# Patient Record
Sex: Female | Born: 1970 | Race: Black or African American | Hispanic: No | Marital: Single | Smoking: Never smoker
Health system: Southern US, Community
[De-identification: ages and names within clinical notes are randomized; demographics above are authoritative.]

## PROBLEM LIST (undated history)

## (undated) DIAGNOSIS — I10 Essential (primary) hypertension: Secondary | ICD-10-CM

---

## 2020-09-03 ENCOUNTER — Encounter (HOSPITAL_COMMUNITY): Payer: Self-pay

## 2020-09-03 ENCOUNTER — Emergency Department (HOSPITAL_COMMUNITY): Payer: BC Managed Care – PPO

## 2020-09-03 ENCOUNTER — Other Ambulatory Visit: Payer: Self-pay

## 2020-09-03 ENCOUNTER — Emergency Department (HOSPITAL_COMMUNITY)
Admission: EM | Admit: 2020-09-03 | Discharge: 2020-09-03 | Disposition: A | Discharge status: Home / Self Care | Payer: BC Managed Care – PPO | Attending: Emergency Medicine | Admitting: Emergency Medicine

## 2020-09-03 DIAGNOSIS — R519 Headache, unspecified: Secondary | ICD-10-CM | POA: Insufficient documentation

## 2020-09-03 DIAGNOSIS — R55 Syncope and collapse: Secondary | ICD-10-CM | POA: Insufficient documentation

## 2020-09-03 DIAGNOSIS — I10 Essential (primary) hypertension: Secondary | ICD-10-CM | POA: Diagnosis not present

## 2020-09-03 HISTORY — DX: Essential (primary) hypertension: I10

## 2020-09-03 LAB — BASIC METABOLIC PANEL
Anion gap: 6 (ref 5–15)
BUN: 14 mg/dL (ref 6–20)
CO2: 26 mmol/L (ref 22–32)
Calcium: 7.2 mg/dL — ABNORMAL LOW (ref 8.9–10.3)
Chloride: 104 mmol/L (ref 98–111)
Creatinine, Ser: 0.75 mg/dL (ref 0.44–1.00)
GFR, Estimated: >60 mL/min (ref >60)
Glucose, Bld: 96 mg/dL (ref 70–99)
Potassium: 3.7 mmol/L (ref 3.5–5.1)
Sodium: 136 mmol/L (ref 135–145)

## 2020-09-03 LAB — CBC
HCT: 40 % (ref 36.0–46.0)
Hemoglobin: 12.8 g/dL (ref 12.0–15.0)
MCH: 27 pg (ref 26.0–34.0)
MCHC: 32 g/dL (ref 30.0–36.0)
MCV: 84.4 fL (ref 80.0–100.0)
Platelets: 352 10*3/uL (ref 150–400)
RBC: 4.74 MIL/uL (ref 3.87–5.11)
RDW: 14 % (ref 11.5–15.5)
WBC: 8.6 10*3/uL (ref 4.0–10.5)
nRBC: 0 % (ref 0.0–0.2)

## 2020-09-03 LAB — I-STAT BETA HCG BLOOD, ED (MC, WL, AP ONLY): I-stat hCG, quantitative: <5 m[IU]/mL (ref <5)

## 2020-09-03 LAB — CBG MONITORING, ED: Glucose-Capillary: 90 mg/dL (ref 70–99)

## 2020-09-03 NOTE — ED Notes (Signed)
Patient said she cannot provide urine sample right now. She will use call bell when able to provide sample.

## 2020-09-03 NOTE — ED Notes (Signed)
CBG:90 

## 2020-09-03 NOTE — Discharge Instructions (Signed)
Eat and drink as well as you can for the next couple days.  Follow up with your family doc.

## 2020-09-03 NOTE — ED Triage Notes (Signed)
Patient BIB [facility] from a concert tonight. 45 minutes ago she went to bathroom, walked down stairs, started having shortness of breath, heat wave, palpitations, then she remembers waking up on the floor. Patient does not remember falling or anything after that. Patient has pain on the right back of head.   20G right AC BP 150/78 CBG 142 RR 18 O2 100% fluids prior to EMS arrival

## 2020-09-03 NOTE — ED Provider Notes (Signed)
[facility]-EMERGENCY DEPT Provider Note   CSN: [id number] Arrival date & time: 09/03/20  0124     History Chief Complaint  Patient presents with  . Loss of Consciousness    Erika Gray is a 50 y.o. female.  50 yo F with a chief complaint of a syncopal event.  Patient was at a concert and suddenly felt warm all over and then attempted to make her way outside and collapsed to the ground.  She denies any injury in the fall.  Has had a mild headache today that is not atypical for her denies neck pain denies chest pain abdominal pain denies nausea vomiting or diarrhea.  She has not had much to eat today.  The history is provided by the patient.  Loss of Consciousness Episode history:  Single Most recent episode:  Today Duration:  2 seconds Timing:  Rare Progression:  Resolved Chronicity:  New Witnessed: yes   Relieved by:  Lying down Worsened by:  Nothing Ineffective treatments:  None tried Associated symptoms: headaches   Associated symptoms: no chest pain, no dizziness, no fever, no nausea, no palpitations, no shortness of breath and no vomiting        Past Medical History:  Diagnosis Date  . Hypertension     There are no problems to display for this patient.   History reviewed. No pertinent surgical history.   OB History   No obstetric history on file.     History reviewed. No pertinent family history.  Social History   Tobacco Use  . Smoking status: Never Smoker  . Smokeless tobacco: Never Used  Substance Use Topics  . Alcohol use: Not Currently  . Drug use: Not Currently    Home Medications Prior to Admission medications   Not on File    Allergies    Patient has no allergy information on record.  Review of Systems   Review of Systems  Constitutional: Negative for chills and fever.  HENT: Negative for congestion and rhinorrhea.   Eyes: Negative for redness and visual disturbance.  Respiratory: Negative for shortness of  breath and wheezing.   Cardiovascular: Positive for syncope. Negative for chest pain and palpitations.  Gastrointestinal: Negative for nausea and vomiting.  Genitourinary: Negative for dysuria and urgency.  Musculoskeletal: Negative for arthralgias and myalgias.  Skin: Negative for pallor and wound.  Neurological: Positive for syncope and headaches. Negative for dizziness.    Physical Exam Updated Vital Signs BP (!) 105/52 (BP Location: Left Arm)   Pulse 82   Temp 98.5 F (36.9 C) (Oral)   Resp 19   Ht 5\' 1"  (1.549 m)   Wt 113.4 kg   SpO2 97%   BMI 47.24 kg/m   Physical Exam Vitals and nursing note reviewed.  Constitutional:      General: She is not in acute distress.    Appearance: She is well-developed. She is not diaphoretic.  HENT:     Head: Normocephalic and atraumatic.  Eyes:     Pupils: Pupils are equal, round, and reactive to light.  Cardiovascular:     Rate and Rhythm: Normal rate and regular rhythm.     Heart sounds: No murmur heard. No friction rub. No gallop.   Pulmonary:     Effort: Pulmonary effort is normal.     Breath sounds: No wheezing or rales.  Abdominal:     General: There is no distension.     Palpations: Abdomen is soft.     Tenderness: There  is no abdominal tenderness.  Musculoskeletal:        General: No tenderness.     Cervical back: Normal range of motion and neck supple.  Skin:    General: Skin is warm and dry.  Neurological:     Mental Status: She is alert and oriented to person, place, and time.     GCS: GCS eye subscore is 4. GCS verbal subscore is 5. GCS motor subscore is 6.     Cranial Nerves: Cranial nerves are intact.     Sensory: Sensation is intact.     Motor: Motor function is intact.     Coordination: Coordination is intact.     Gait: Gait is intact.     Comments: Benign neurologic exam  Psychiatric:        Behavior: Behavior normal.     ED Results / Procedures / Treatments   Labs (all labs ordered are listed, but  only abnormal results are displayed) Labs Reviewed  BASIC METABOLIC PANEL - Abnormal; Notable for the following components:      Result Value   Calcium 7.2 (*)    All other components within normal limits  CBC  URINALYSIS, ROUTINE W REFLEX MICROSCOPIC  CBG MONITORING, ED  I-STAT BETA HCG BLOOD, ED ([facility], [facility], [facility] ONLY)    EKG EKG Interpretation  Date/Time:  Sunday September 03 2020 01:48:19 EDT Ventricular Rate:  81 PR Interval:  158 QRS Duration: 96 QT Interval:  389 QTC Calculation: 452 R Axis:   -27 Text Interpretation: Sinus rhythm Borderline left axis deviation no wpw, prolonged qt orbrugada No old tracing to compare Confirmed by Lenyx Boody ([id number]) on 09/03/2020 2:08:31 AM   Radiology CT HEAD WO CONTRAST  Result Date: 09/03/2020 CLINICAL DATA:  Syncope and dizziness. Head trauma after a fall. Shortness of breath and palpitations. EXAM: CT HEAD WITHOUT CONTRAST TECHNIQUE: Contiguous axial images were obtained from the base of the skull through the vertex without intravenous contrast. COMPARISON:  None. FINDINGS: Brain: No evidence of acute infarction, hemorrhage, hydrocephalus, extra-axial collection or mass lesion/mass effect. Vascular: No hyperdense vessel or unexpected calcification. Skull: Normal. Negative for fracture or focal lesion. Sinuses/Orbits: No acute finding. Other: None. IMPRESSION: No acute intracranial abnormalities. Electronically Signed   By: William  Stevens M.D.   On: 09/03/2020 02:25    Procedures Procedures   Medications Ordered in ED Medications - No data to display  ED Course  I have reviewed the triage vital signs and the nursing notes.  Pertinent labs & imaging results that were available during my care of the patient were reviewed by me and considered in my medical decision making (see chart for details).    MDM Rules/Calculators/A&P                          49  yo F with a chief complaints of a syncopal event.  Sounds vasovagal by history.  She is  describing a headache.  The only on review of systems.  She is well-appearing and nontoxic.  Benign neurologic exam.  Will obtain a laboratory evaluation.  Lab work-up without significant anemia.  No significant electrolyte abnormality.  EKG without concerning finding.  A CT of the head was ordered through the triage process and is negative for acute intracranial pathology.  Will discharge the patient home.  PCP follow-up.  4:23 AM:  I have discussed the diagnosis/risks/treatment options with the patient and believe the pt to be eligible for discharge home  to follow-up with PCP. We also discussed returning to the ED immediately if new or worsening sx occur. We discussed the sx which are most concerning (e.g., sudden worsening pain, fever, inability to tolerate by mouth) that necessitate immediate return. Medications administered to the patient during their visit and any new prescriptions provided to the patient are listed below.  Medications given during this visit Medications - No data to display   The patient appears reasonably screen and/or stabilized for discharge and I doubt any other medical condition or other [facility] requiring further screening, evaluation, or treatment in the ED at this time prior to discharge.   Final Clinical Impression(s) / ED Diagnoses Final diagnoses:  Syncope and collapse    Rx / DC Orders ED Discharge Orders    None       Melene Plan, DO 09/03/20 0423

## 2022-05-19 IMAGING — CT CT HEAD W/O CM
3 series · 16 of 47 positions shown, 19 images · non-contrast
Comparison: None.

CLINICAL DATA: Syncope and dizziness. Head trauma after a fall.
Shortness of breath and palpitations.

EXAM:
CT HEAD WITHOUT CONTRAST
TECHNIQUE: Contiguous axial images were obtained from the base of the skull
through the vertex without intravenous contrast.

[Series 2: head wo · axial · 0.41mm/px · z∈[-143,-13]mm · 10 of 32 slices shown, 13 images]
[im 3/32  brain]
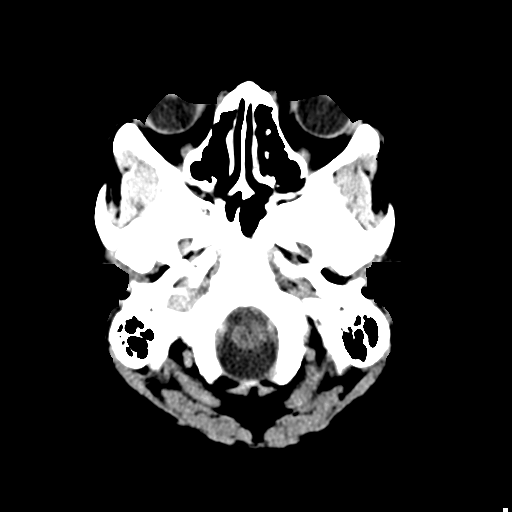
[im 3/32  bone]
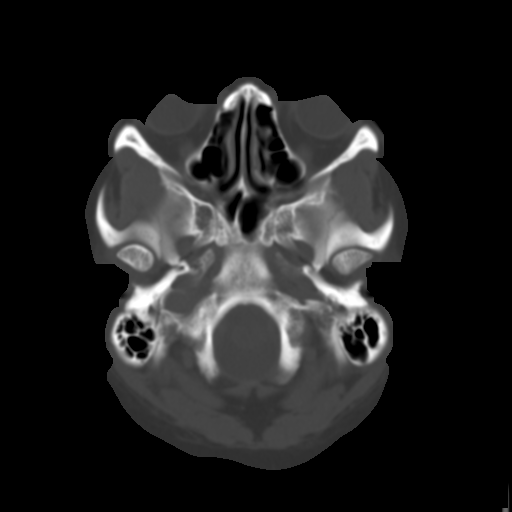
[im 6/32  brain]
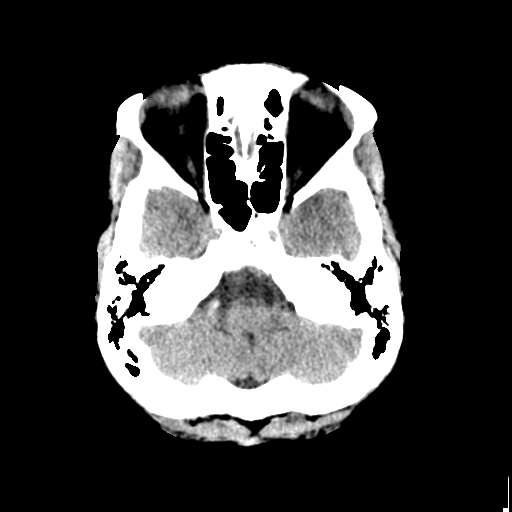
[im 9/32  brain]
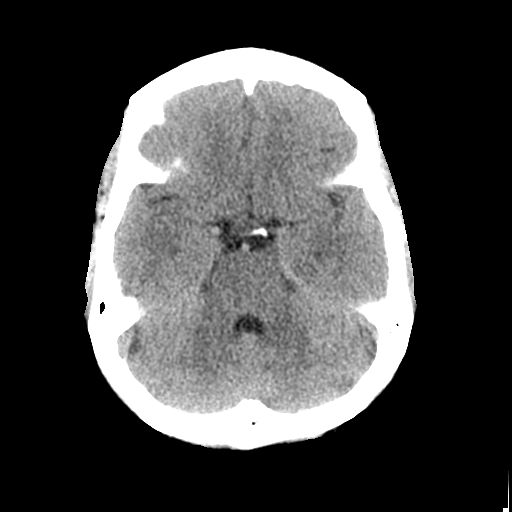
[im 11/32  brain]
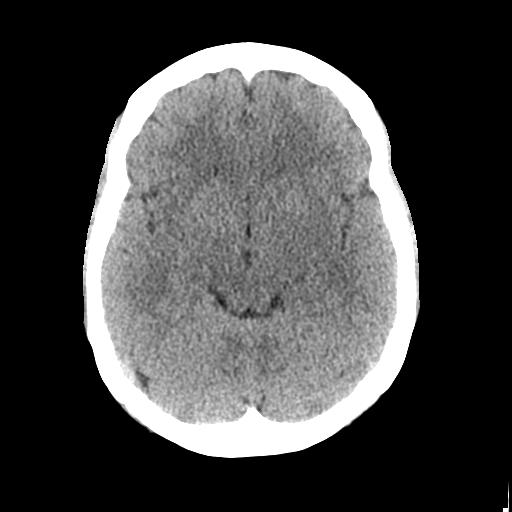
[im 14/32  brain]
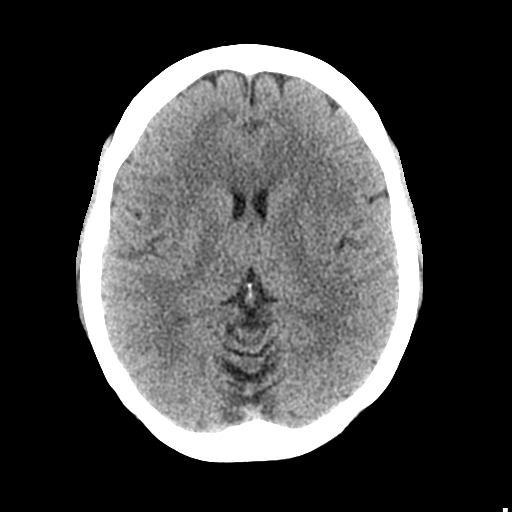
[im 14/32  bone]
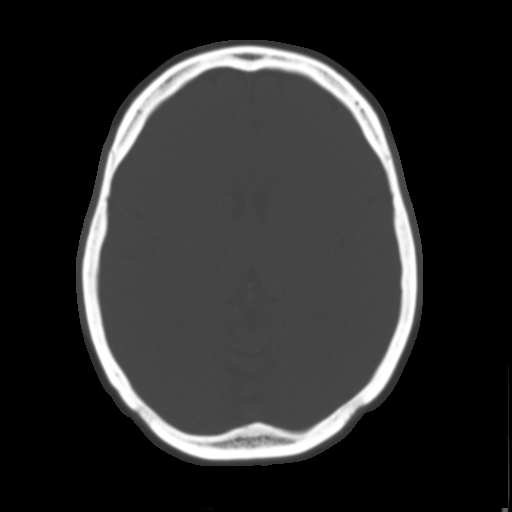
[im 18/32  brain]
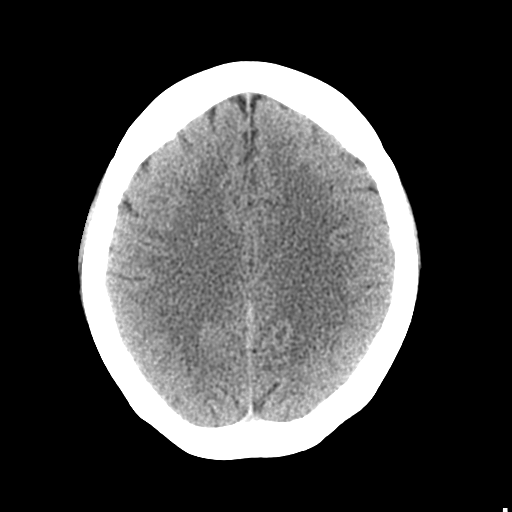
[im 21/32  brain]
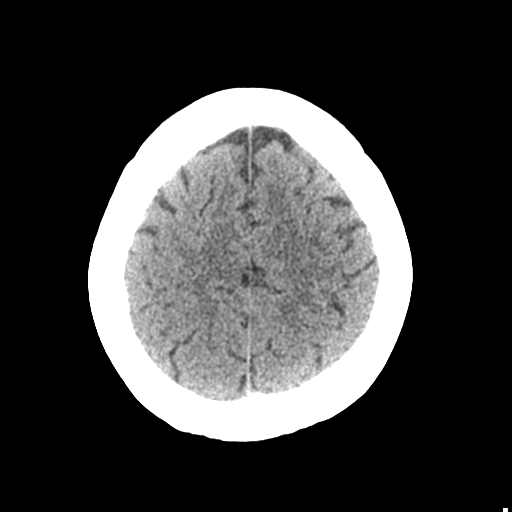
[im 24/32  brain]
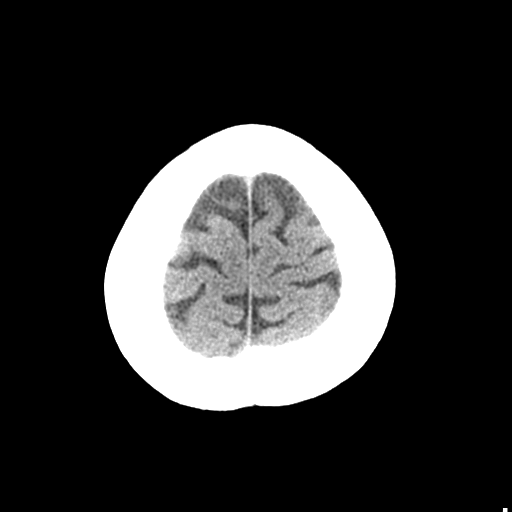
[im 26/32  brain]
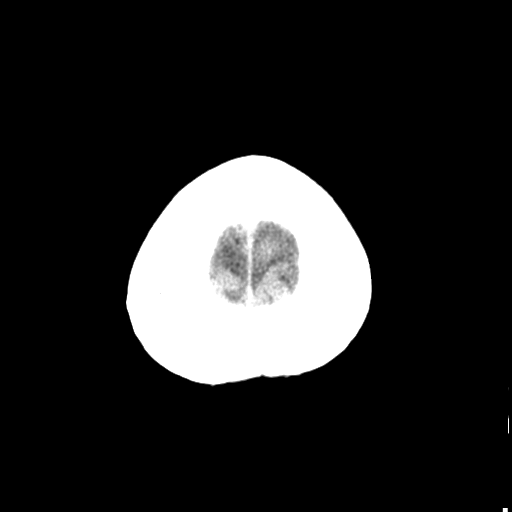
[im 26/32  bone]
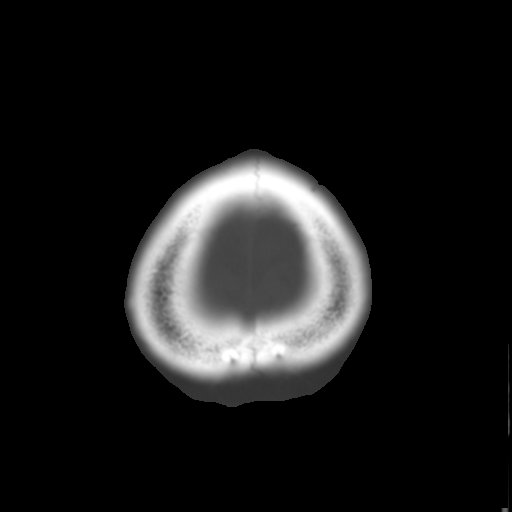
[im 29/32  brain]
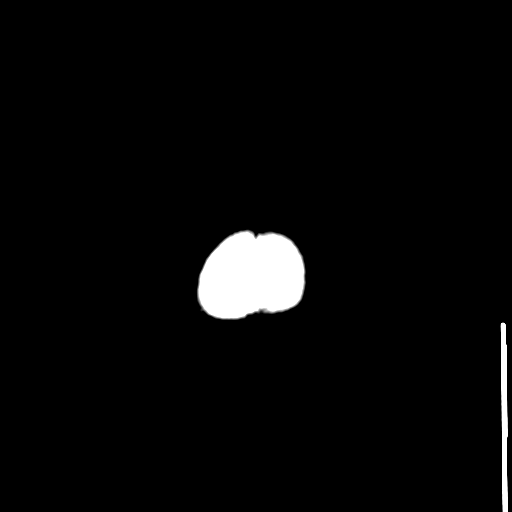

[Series 5: coronal soft tissue · coronal · 0.31mm/px · 3 of 67 slices shown]
[im 23/67  brain]
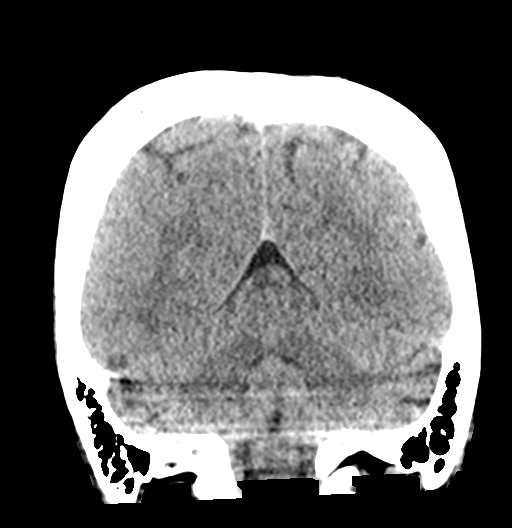
[im 30/67  brain]
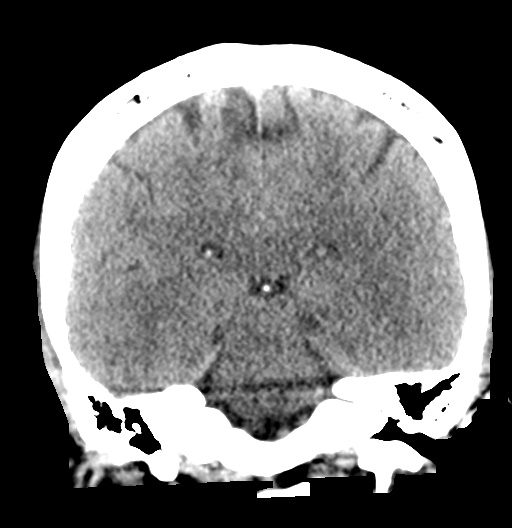
[im 37/67  brain]
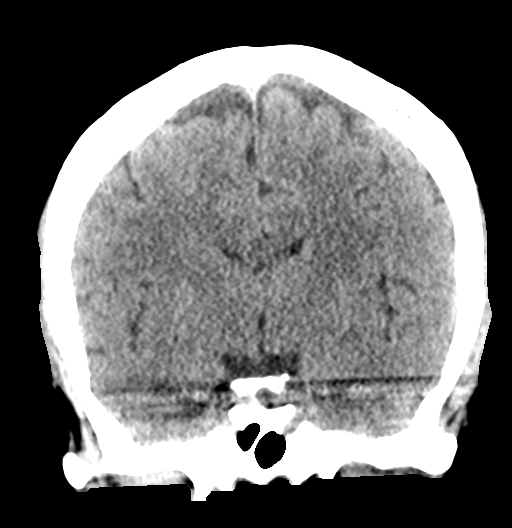

[Series 6: sagittal soft tissue · sagittal · 0.33mm/px · 3 of 52 slices shown]
[im 18/52  brain]
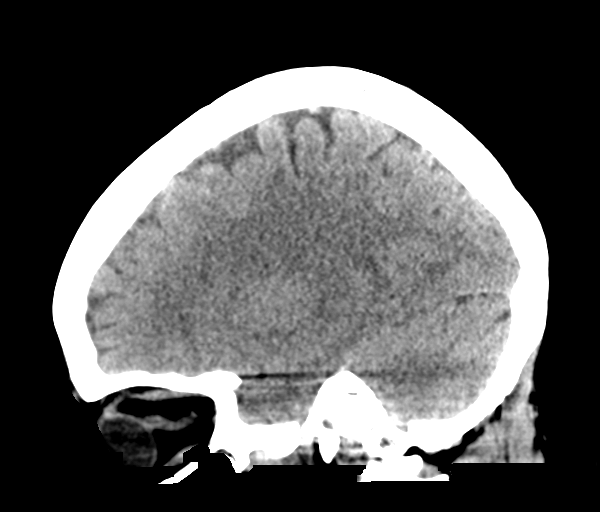
[im 26/52  brain]
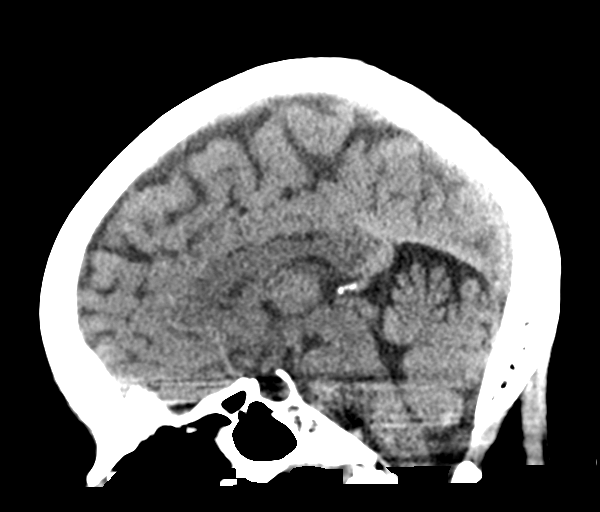
[im 35/52  brain]
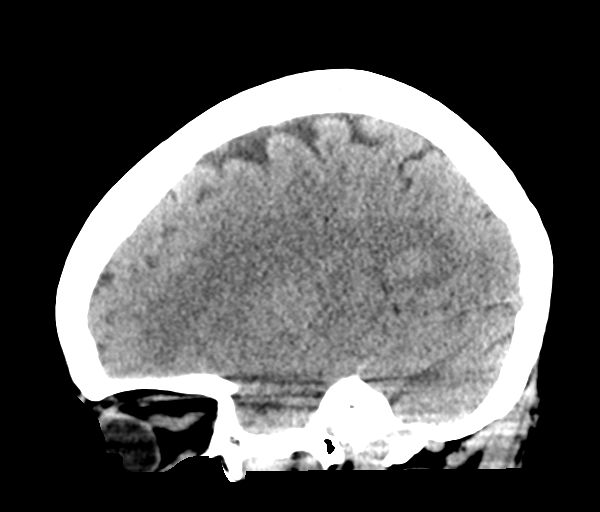

[16 of 47 positions shown; findings below may reference images not displayed]

FINDINGS: Brain: No evidence of acute infarction, hemorrhage, hydrocephalus,
extra-axial collection or mass lesion/mass effect.

Vascular: No hyperdense vessel or unexpected calcification.

Skull: Normal. Negative for fracture or focal lesion.

Sinuses/Orbits: No acute finding.

Other: None.
IMPRESSION: No acute intracranial abnormalities.
